# Patient Record
Sex: Female | Born: 1960 | Race: White | Hispanic: No | Marital: Married | Smoking: Current every day smoker
Health system: Southern US, Community
[De-identification: ages and names within clinical notes are randomized; demographics above are authoritative.]

## PROBLEM LIST (undated history)

## (undated) DIAGNOSIS — S069X9A Unspecified intracranial injury with loss of consciousness of unspecified duration, initial encounter: Secondary | ICD-10-CM

## (undated) DIAGNOSIS — E7800 Pure hypercholesterolemia, unspecified: Secondary | ICD-10-CM

## (undated) DIAGNOSIS — S069XAA Unspecified intracranial injury with loss of consciousness status unknown, initial encounter: Secondary | ICD-10-CM

## (undated) DIAGNOSIS — F419 Anxiety disorder, unspecified: Secondary | ICD-10-CM

## (undated) DIAGNOSIS — G43909 Migraine, unspecified, not intractable, without status migrainosus: Secondary | ICD-10-CM

## (undated) DIAGNOSIS — E079 Disorder of thyroid, unspecified: Secondary | ICD-10-CM

## (undated) HISTORY — PX: FOOT SURGERY: SHX648

## (undated) HISTORY — PX: ECTOPIC PREGNANCY SURGERY: SHX613

## (undated) HISTORY — DX: Unspecified intracranial injury with loss of consciousness status unknown, initial encounter: S06.9XAA

## (undated) HISTORY — DX: Anxiety disorder, unspecified: F41.9

## (undated) HISTORY — DX: Disorder of thyroid, unspecified: E07.9

## (undated) HISTORY — DX: Pure hypercholesterolemia, unspecified: E78.00

## (undated) HISTORY — PX: SALPINGECTOMY: SHX328

## (undated) HISTORY — DX: Unspecified intracranial injury with loss of consciousness of unspecified duration, initial encounter: S06.9X9A

## (undated) HISTORY — DX: Migraine, unspecified, not intractable, without status migrainosus: G43.909

---

## 2009-11-02 ENCOUNTER — Encounter: Admission: RE | Admit: 2009-11-02 | Discharge: 2009-11-02 | Payer: Self-pay | Admitting: Family Medicine

## 2009-11-16 ENCOUNTER — Ambulatory Visit (HOSPITAL_COMMUNITY): Admission: RE | Admit: 2009-11-16 | Discharge: 2009-11-16 | Payer: Self-pay | Admitting: General Surgery

## 2009-12-06 ENCOUNTER — Encounter: Admission: RE | Admit: 2009-12-06 | Discharge: 2009-12-06 | Payer: Self-pay | Admitting: General Surgery

## 2010-09-08 HISTORY — PX: CHOLECYSTECTOMY: SHX55

## 2010-09-29 ENCOUNTER — Encounter: Payer: Self-pay | Admitting: Family Medicine

## 2010-12-02 LAB — COMPREHENSIVE METABOLIC PANEL
Albumin: 3.5 g/dL (ref 3.5–5.2)
BUN: 13 mg/dL (ref 6–23)
Creatinine, Ser: 0.78 mg/dL (ref 0.4–1.2)
GFR calc Af Amer: >60 mL/min (ref >60)
Glucose, Bld: 95 mg/dL (ref 70–99)
Potassium: 5.4 mEq/L — ABNORMAL HIGH (ref 3.5–5.1)
Total Bilirubin: 0.3 mg/dL (ref 0.3–1.2)
Total Protein: 7 g/dL (ref 6.0–8.3)

## 2019-01-03 ENCOUNTER — Ambulatory Visit: Payer: Self-pay | Admitting: Neurology

## 2019-03-03 ENCOUNTER — Telehealth: Payer: Self-pay | Admitting: *Deleted

## 2019-03-03 NOTE — Telephone Encounter (Signed)
I called the patient and LVM on home # asking for cb to discuss appt on Monday. I called the mobile # and spoke with pt. She is comfortable coming into the office. She understands masks are required and she will bring her own. Husband will also be coming to help with the history. She confirmed both have not been exposed to anyone known to have covid19 or supspected of having it within the last 2 weeks. Also she confirmed they have not been having any fever, cough, SOB, chills, respiratory symptoms in last 2 weeks. Also not traveled out of state or country in last 2 weeks. Neither has tested positive for covid19. She verbalized appreciation for the call and will be here 15-30 minutes early and understands the check-in process.

## 2019-03-07 ENCOUNTER — Ambulatory Visit: Payer: Commercial Managed Care - PPO | Admitting: Neurology

## 2019-03-07 ENCOUNTER — Other Ambulatory Visit: Payer: Self-pay

## 2019-03-07 ENCOUNTER — Encounter: Payer: Self-pay | Admitting: Neurology

## 2019-03-07 VITALS — BP 142/84 | HR 64 | Temp 96.8°F | Ht 61.0 in | Wt 180.0 lb

## 2019-03-07 DIAGNOSIS — H9190 Unspecified hearing loss, unspecified ear: Secondary | ICD-10-CM

## 2019-03-07 DIAGNOSIS — R270 Ataxia, unspecified: Secondary | ICD-10-CM

## 2019-03-07 DIAGNOSIS — G43101 Migraine with aura, not intractable, with status migrainosus: Secondary | ICD-10-CM

## 2019-03-07 DIAGNOSIS — R2689 Other abnormalities of gait and mobility: Secondary | ICD-10-CM

## 2019-03-07 DIAGNOSIS — H532 Diplopia: Secondary | ICD-10-CM | POA: Diagnosis not present

## 2019-03-07 DIAGNOSIS — R55 Syncope and collapse: Secondary | ICD-10-CM

## 2019-03-07 DIAGNOSIS — G3281 Cerebellar ataxia in diseases classified elsewhere: Secondary | ICD-10-CM

## 2019-03-07 MED ORDER — ALPRAZOLAM 0.25 MG PO TABS: ORAL_TABLET | ORAL | 0 refills | Status: DC

## 2019-03-07 MED ORDER — ATENOLOL 25 MG PO TABS: 25.0000 mg | ORAL_TABLET | Freq: Every day | ORAL | 6 refills | Status: DC
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
## 2019-03-07 NOTE — Patient Instructions (Addendum)
Cut Atenolol in half Email me blood pressure readings over the next week - 2 weeks Then we may stop Arenolol, but may need to start a different blood pressure medication If we needed to start a different medication that controlled blood pressure and migraines: Verapamil or Amlodipine Also consider Ajovy or Emgality or Aimovig if needed for migraine   Fremanezumab injection What is this medicine? FREMANEZUMAB (fre ma NEZ ue mab) is used to prevent migraine headaches. This medicine may be used for other purposes; ask your health care provider or pharmacist if you have questions. COMMON BRAND NAME(S): AJOVY What should I tell my health care provider before I take this medicine? They need to know if you have any of these conditions:  an unusual or allergic reaction to fremanezumab, other medicines, foods, dyes, or preservatives  pregnant or trying to get pregnant  breast-feeding How should I use this medicine? This medicine is for injection under the skin. You will be taught how to prepare and give this medicine. Use exactly as directed. Take your medicine at regular intervals. Do not take your medicine more often than directed. It is important that you put your used needles and syringes in a special sharps container. Do not put them in a trash can. If you do not have a sharps container, call your pharmacist or healthcare provider to get one. Talk to your pediatrician regarding the use of this medicine in children. Special care may be needed. Overdosage: If you think you have taken too much of this medicine contact a poison control center or emergency room at once. NOTE: This medicine is only for you. Do not share this medicine with others. What if I miss a dose? If you miss a dose, take it as soon as you can. If it is almost time for your next dose, take only that dose. Do not take double or extra doses. What may interact with this medicine? Interactions are not expected. This list may not  describe all possible interactions. Give your health care provider a list of all the medicines, herbs, non-prescription drugs, or dietary supplements you use. Also tell them if you smoke, drink alcohol, or use illegal drugs. Some items may interact with your medicine. What should I watch for while using this medicine? Tell your doctor or healthcare professional if your symptoms do not start to get better or if they get worse. What side effects may I notice from receiving this medicine? Side effects that you should report to your doctor or health care professional as soon as possible:  allergic reactions like skin rash, itching or hives, swelling of the face, lips, or tongue Side effects that usually do not require medical attention (report these to your doctor or health care professional if they continue or are bothersome):  pain, redness, or irritation at site where injected This list may not describe all possible side effects. Call your doctor for medical advice about side effects. You may report side effects to FDA at 1-800-FDA-1088. Where should I keep my medicine? Keep out of the reach of children. You will be instructed on how to store this medicine. Throw away any unused medicine after the expiration date on the label. NOTE: This sheet is a summary. It may not cover all possible information. If you have questions about this medicine, talk to your doctor, pharmacist, or health care provider.  2020 Elsevier/Gold Standard (2017-05-25 17:22:56)  Verapamil tablets What is this medicine? VERAPAMIL (ver AP a mil) is a calcium-channel blocker.  It affects the amount of calcium found in your heart and muscle cells. This relaxes your blood vessels, which can reduce the amount of work the heart has to do. This medicine is used to treat chest pain caused by angina, high blood pressure, and controls heart rate in certain conditions. This medicine may be used for other purposes; ask your health care  provider or pharmacist if you have questions. COMMON BRAND NAME(S): Calan What should I tell my health care provider before I take this medicine? They need to know if you have any of these conditions:  heart or blood vessel disease  heart rhythm disturbances such as sick sinus syndrome, ventricular arrhythmias, Wolff-Parkinson-White syndrome, or Lown-Ganong-Levine syndrome  liver or kidney disease  low blood pressure  an unusual or allergic reaction to verapamil, other medicines, foods, dyes, or preservatives  pregnant or trying to get pregnant  breast-feeding How should I use this medicine? Take this medicine by mouth with a glass of water. Follow the directions on the prescription label. This medicine can be taken with or without food. Take your doses at regular intervals. Do not take your medicine more often than directed. Talk to your pediatrician regarding the use of this medicine in children. Special care may be needed. Overdosage: If you think you have taken too much of this medicine contact a poison control center or emergency room at once. NOTE: This medicine is only for you. Do not share this medicine with others. What if I miss a dose? If you miss a dose, take it as soon as you can. If it is almost time for your next dose, take only that dose. Do not take double or extra doses. What may interact with this medicine? Do not take this medicine with any of the following:  cisapride  disopyramide  dofetilide  grapefruit juice  hawthorn  pimozide  red yeast rice This medicine may also interact with the following medications:  barbiturates such as phenobarbital  cimetidine  cyclosporine  lithium  local anesthetics or general anesthetics  medicines for heart rhythm problems like amiodarone, digoxin, flecainide, procainamide, quinidine  medicines for high blood pressure or heart problems  medicines for seizures like carbamazepine and phenytoin  rifampin,  rifabutin or rifapentine  theophylline or aminophylline This list may not describe all possible interactions. Give your health care provider a list of all the medicines, herbs, non-prescription drugs, or dietary supplements you use. Also tell them if you smoke, drink alcohol, or use illegal drugs. Some items may interact with your medicine. What should I watch for while using this medicine? Check your blood pressure and pulse rate regularly. Ask your doctor or health care professional what your blood pressure and pulse rate should be and when you should contact him or her. Do not suddenly stop taking this medicine. Ask your doctor or health care professional how to gradually reduce the dose. You may get drowsy or dizzy. Do not drive, use machinery, or do anything that needs mental alertness until you know how this medicine affects you. Do not stand or sit up quickly, especially if you are an older patient. This reduces the risk of dizzy or fainting spells. Alcohol may interfere with the effect of this medicine. Avoid alcoholic drinks. What side effects may I notice from receiving this medicine? Side effects that you should report to your doctor or health care professional as soon as possible:  difficulty breathing  dizziness or light headedness  fainting  fast heartbeat, palpitations, irregular heartbeat,  or chest pain  skin rash  slow heartbeat  swelling of the legs or ankles Side effects that usually do not require medical attention (report to your doctor or health care professional if they continue or are bothersome):  constipation  facial flushing  headache  nausea, vomiting  sexual dysfunction  weakness or tiredness This list may not describe all possible side effects. Call your doctor for medical advice about side effects. You may report side effects to FDA at 1-800-FDA-1088. Where should I keep my medicine? Keep out of the reach of children. Store at room temperature  between 15 and 25 degrees C (59 and 77 degrees F). Protect from light. Keep container tightly closed. Throw away any unused medicine after the expiration date. NOTE: This sheet is a summary. It may not cover all possible information. If you have questions about this medicine, talk to your doctor, pharmacist, or health care provider.  2020 Elsevier/Gold Standard (2008-05-22 17:23:53)   Amlodipine tablets What is this medicine? AMLODIPINE (am LOE di peen) is a calcium-channel blocker. It affects the amount of calcium found in your heart and muscle cells. This relaxes your blood vessels, which can reduce the amount of work the heart has to do. This medicine is used to lower high blood pressure. It is also used to prevent chest pain. This medicine may be used for other purposes; ask your health care provider or pharmacist if you have questions. COMMON BRAND NAME(S): Norvasc What should I tell my health care provider before I take this medicine? They need to know if you have any of these conditions:  heart disease  liver disease  an unusual or allergic reaction to amlodipine, other medicines, foods, dyes, or preservatives  pregnant or trying to get pregnant  breast-feeding How should I use this medicine? Take this medicine by mouth with a glass of water. Follow the directions on the prescription label. You can take it with or without food. If it upsets your stomach, take it with food. Take your medicine at regular intervals. Do not take it more often than directed. Do not stop taking except on your doctor's advice. Talk to your pediatrician regarding the use of this medicine in children. While this drug may be prescribed for children as young as 6 years for selected conditions, precautions do apply. Patients over 59 years of age may have a stronger reaction and need a smaller dose. Overdosage: If you think you have taken too much of this medicine contact a poison control center or emergency  room at once. NOTE: This medicine is only for you. Do not share this medicine with others. What if I miss a dose? If you miss a dose, take it as soon as you can. If it is almost time for your next dose, take only that dose. Do not take double or extra doses. What may interact with this medicine? Do not take this medicine with any of the following medications:  tranylcypromine This medicine may also interact with the following medications:  clarithromycin  cyclosporine  diltiazem  itraconazole  simvastatin  tacrolimus This list may not describe all possible interactions. Give your health care provider a list of all the medicines, herbs, non-prescription drugs, or dietary supplements you use. Also tell them if you smoke, drink alcohol, or use illegal drugs. Some items may interact with your medicine. What should I watch for while using this medicine? Visit your healthcare professional for regular checks on your progress. Check your blood pressure as directed. Ask  your healthcare professional what your blood pressure should be and when you should contact him or her. Do not treat yourself for coughs, colds, or pain while you are using this medicine without asking your healthcare professional for advice. Some medicines may increase your blood pressure. You may get dizzy. Do not drive, use machinery, or do anything that needs mental alertness until you know how this medicine affects you. Do not stand or sit up quickly, especially if you are an older patient. This reduces the risk of dizzy or fainting spells. Avoid alcoholic drinks; they can make you dizzier. What side effects may I notice from receiving this medicine? Side effects that you should report to your doctor or health care professional as soon as possible:  allergic reactions like skin rash, itching or hives; swelling of the face, lips, or tongue  fast, irregular heartbeat  signs and symptoms of low blood pressure like dizziness;  feeling faint or lightheaded, falls; unusually weak or tired  swelling of ankles, feet, hands Side effects that usually do not require medical attention (report these to your doctor or health care professional if they continue or are bothersome):  dry mouth  facial flushing  headache  stomach pain  tiredness This list may not describe all possible side effects. Call your doctor for medical advice about side effects. You may report side effects to FDA at 1-800-FDA-1088. Where should I keep my medicine? Keep out of the reach of children. Store at room temperature between 59 and 86 degrees F (15 and 30 degrees C). Throw away any unused medicine after the expiration date. NOTE: This sheet is a summary. It may not cover all possible information. If you have questions about this medicine, talk to your doctor, pharmacist, or health care provider.  2020 Elsevier/Gold Standard (2018-03-19 15:07:10)

## 2019-03-08 ENCOUNTER — Other Ambulatory Visit: Payer: Self-pay | Admitting: Neurology

## 2019-03-08 ENCOUNTER — Telehealth: Payer: Self-pay | Admitting: *Deleted

## 2019-03-08 DIAGNOSIS — D72829 Elevated white blood cell count, unspecified: Secondary | ICD-10-CM

## 2019-03-08 LAB — CBC
Hematocrit: 42.4 % (ref 34.0–46.6)
Hemoglobin: 14.3 g/dL (ref 11.1–15.9)
MCH: 30.2 pg (ref 26.6–33.0)
MCHC: 33.7 g/dL (ref 31.5–35.7)
MCV: 90 fL (ref 79–97)
Platelets: 417 10*3/uL (ref 150–450)
RBC: 4.74 x10E6/uL (ref 3.77–5.28)
RDW: 12.8 % (ref 11.7–15.4)
WBC: 13.4 10*3/uL — ABNORMAL HIGH (ref 3.4–10.8)

## 2019-03-08 LAB — BASIC METABOLIC PANEL
BUN/Creatinine Ratio: 35 — ABNORMAL HIGH (ref 9–23)
BUN: 30 mg/dL — ABNORMAL HIGH (ref 6–24)
CO2: 22 mmol/L (ref 20–29)
Calcium: 9.3 mg/dL (ref 8.7–10.2)
Chloride: 107 mmol/L — ABNORMAL HIGH (ref 96–106)
Creatinine, Ser: 0.85 mg/dL (ref 0.57–1.00)
GFR calc Af Amer: 87 mL/min/{1.73_m2} (ref >59)
GFR calc non Af Amer: 75 mL/min/{1.73_m2} (ref >59)
Glucose: 90 mg/dL (ref 65–99)
Potassium: 4.7 mmol/L (ref 3.5–5.2)
Sodium: 142 mmol/L (ref 134–144)

## 2019-03-08 NOTE — Telephone Encounter (Signed)
Spoke with pt. She understands the lab results. She will come tomorrow at 10:30 for lab redraw. She also stated she has been hydrating, said things have been different as far as her fluid retention since she started "this pill". She will bring her own mask.

## 2019-03-08 NOTE — Telephone Encounter (Signed)
-----   Message from Melvenia Beam, MD sent at 03/08/2019 10:00 AM EDT ----- Her white blood cells are elevated and she look dehydrated. Can she come back and repeat the CBC to make sure there were no errors?

## 2019-03-09 ENCOUNTER — Other Ambulatory Visit: Payer: Self-pay

## 2019-03-09 ENCOUNTER — Other Ambulatory Visit (INDEPENDENT_AMBULATORY_CARE_PROVIDER_SITE_OTHER): Payer: Self-pay

## 2019-03-09 ENCOUNTER — Telehealth: Payer: Self-pay | Admitting: Neurology

## 2019-03-09 DIAGNOSIS — Z0289 Encounter for other administrative examinations: Secondary | ICD-10-CM

## 2019-03-09 DIAGNOSIS — D72829 Elevated white blood cell count, unspecified: Secondary | ICD-10-CM

## 2019-03-09 NOTE — Telephone Encounter (Signed)
Pt returning call please call back °

## 2019-03-09 NOTE — Telephone Encounter (Signed)
I spoke to the patient she is scheduled for 03/16/19 at [facility].  °

## 2019-03-09 NOTE — Telephone Encounter (Signed)
LVM for pt to call back about scheduling mri  [facility] auth: [id number] (exp. 03/09/19 to 04/09/19)

## 2019-03-10 LAB — CBC WITH DIFFERENTIAL/PLATELET
Basophils Absolute: 0.1 10*3/uL (ref 0.0–0.2)
Basos: 1 %
EOS (ABSOLUTE): 0.4 10*3/uL (ref 0.0–0.4)
Eos: 4 %
Hematocrit: 41.6 % (ref 34.0–46.6)
Hemoglobin: 13.9 g/dL (ref 11.1–15.9)
Immature Grans (Abs): 0 10*3/uL (ref 0.0–0.1)
Immature Granulocytes: 0 %
Lymphocytes Absolute: 3.8 10*3/uL — ABNORMAL HIGH (ref 0.7–3.1)
Lymphs: 34 %
MCH: 29.8 pg (ref 26.6–33.0)
MCHC: 33.4 g/dL (ref 31.5–35.7)
MCV: 89 fL (ref 79–97)
Monocytes Absolute: 0.9 10*3/uL (ref 0.1–0.9)
Monocytes: 8 %
Neutrophils Absolute: 6 10*3/uL (ref 1.4–7.0)
Neutrophils: 53 %
Platelets: 392 10*3/uL (ref 150–450)
RBC: 4.66 x10E6/uL (ref 3.77–5.28)
RDW: 12.6 % (ref 11.7–15.4)
WBC: 11.2 10*3/uL — ABNORMAL HIGH (ref 3.4–10.8)

## 2019-03-16 ENCOUNTER — Other Ambulatory Visit: Payer: Self-pay

## 2019-03-16 ENCOUNTER — Ambulatory Visit: Payer: Commercial Managed Care - PPO

## 2019-03-16 DIAGNOSIS — H9190 Unspecified hearing loss, unspecified ear: Secondary | ICD-10-CM

## 2019-03-16 DIAGNOSIS — R270 Ataxia, unspecified: Secondary | ICD-10-CM

## 2019-03-16 DIAGNOSIS — R2689 Other abnormalities of gait and mobility: Secondary | ICD-10-CM

## 2019-03-16 DIAGNOSIS — H532 Diplopia: Secondary | ICD-10-CM | POA: Diagnosis not present

## 2019-03-16 DIAGNOSIS — R55 Syncope and collapse: Secondary | ICD-10-CM

## 2019-03-16 MED ORDER — GADOBENATE DIMEGLUMINE 529 MG/ML IV SOLN
17.0000 mL | Freq: Once | INTRAVENOUS | Status: AC | PRN
  Administered 2019-03-16: 17 mL via INTRAVENOUS

## 2019-05-25 ENCOUNTER — Other Ambulatory Visit: Payer: Self-pay | Admitting: Neurology

## 2019-06-13 ENCOUNTER — Ambulatory Visit: Payer: Commercial Managed Care - PPO | Admitting: Neurology

## 2019-07-13 ENCOUNTER — Ambulatory Visit: Payer: Commercial Managed Care - PPO | Admitting: Neurology

## 2019-08-14 ENCOUNTER — Other Ambulatory Visit: Payer: Self-pay | Admitting: Neurology

## 2019-09-07 ENCOUNTER — Other Ambulatory Visit: Payer: Self-pay | Admitting: Neurology

## 2019-09-12 ENCOUNTER — Ambulatory Visit: Payer: Commercial Managed Care - PPO | Admitting: Neurology

## 2019-09-12 ENCOUNTER — Encounter: Payer: Self-pay | Admitting: Neurology

## 2019-09-12 ENCOUNTER — Other Ambulatory Visit: Payer: Self-pay

## 2019-09-12 VITALS — BP 137/86 | HR 60 | Temp 98.0°F | Ht 60.0 in | Wt 173.0 lb

## 2019-09-12 DIAGNOSIS — H8190 Unspecified disorder of vestibular function, unspecified ear: Secondary | ICD-10-CM

## 2019-09-12 MED ORDER — ONDANSETRON 4 MG PO TBDP: 4.0000 mg | ORAL_TABLET | Freq: Three times a day (TID) | ORAL | 6 refills | Status: AC | PRN
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
## 2019-10-03 ENCOUNTER — Other Ambulatory Visit: Payer: Self-pay | Admitting: Neurology

## 2019-10-30 ENCOUNTER — Other Ambulatory Visit: Payer: Self-pay | Admitting: Neurology

## 2019-11-03 ENCOUNTER — Other Ambulatory Visit: Payer: Self-pay | Admitting: Neurology

## 2019-11-25 ENCOUNTER — Other Ambulatory Visit: Payer: Self-pay | Admitting: Neurology

## 2021-02-26 ENCOUNTER — Other Ambulatory Visit: Payer: Self-pay | Admitting: Physician Assistant

## 2021-02-26 DIAGNOSIS — Z122 Encounter for screening for malignant neoplasm of respiratory organs: Secondary | ICD-10-CM

## 2021-02-26 DIAGNOSIS — Z1231 Encounter for screening mammogram for malignant neoplasm of breast: Secondary | ICD-10-CM

## 2021-02-26 DIAGNOSIS — Z720 Tobacco use: Secondary | ICD-10-CM

## 2021-03-14 ENCOUNTER — Other Ambulatory Visit: Payer: Self-pay

## 2021-03-14 ENCOUNTER — Ambulatory Visit
Admission: RE | Admit: 2021-03-14 | Discharge: 2021-03-14 | Disposition: A | Discharge status: Home / Self Care | Payer: Commercial Managed Care - PPO | Source: Ambulatory Visit | Attending: Physician Assistant | Admitting: Physician Assistant

## 2021-03-14 DIAGNOSIS — Z1231 Encounter for screening mammogram for malignant neoplasm of breast: Secondary | ICD-10-CM

## 2021-03-18 ENCOUNTER — Ambulatory Visit: Payer: Self-pay

## 2022-01-22 IMAGING — MG MM DIGITAL SCREENING BILAT W/ TOMO AND CAD
6 of 10 series · 6 of 30 positions shown · non-contrast
Comparison: Previous exam(s).

CLINICAL DATA: Screening.

EXAM:
DIGITAL SCREENING BILATERAL MAMMOGRAM WITH TOMOSYNTHESIS AND CAD
TECHNIQUE: Bilateral screening digital craniocaudal and mediolateral oblique
mammograms were obtained. Bilateral screening digital breast
tomosynthesis was performed. The images were evaluated with
computer-aided detection.

[R CC synth-2D]
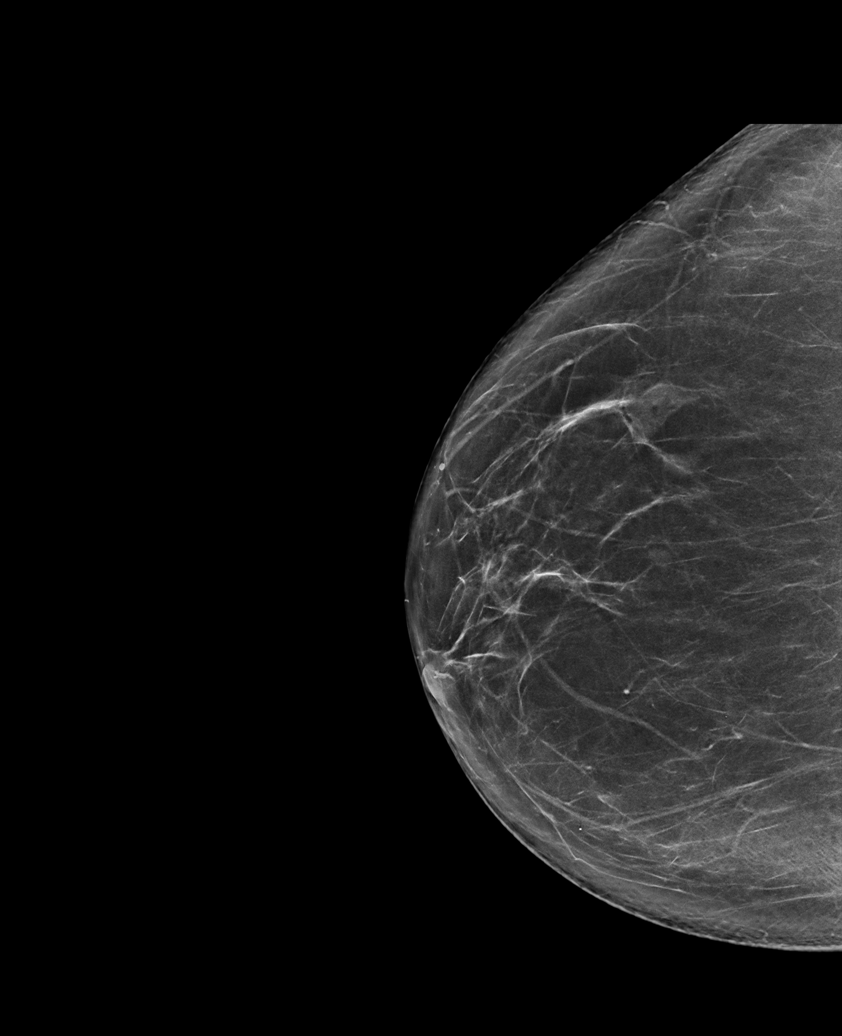

[L MLO synth-2D (1 of 2)]
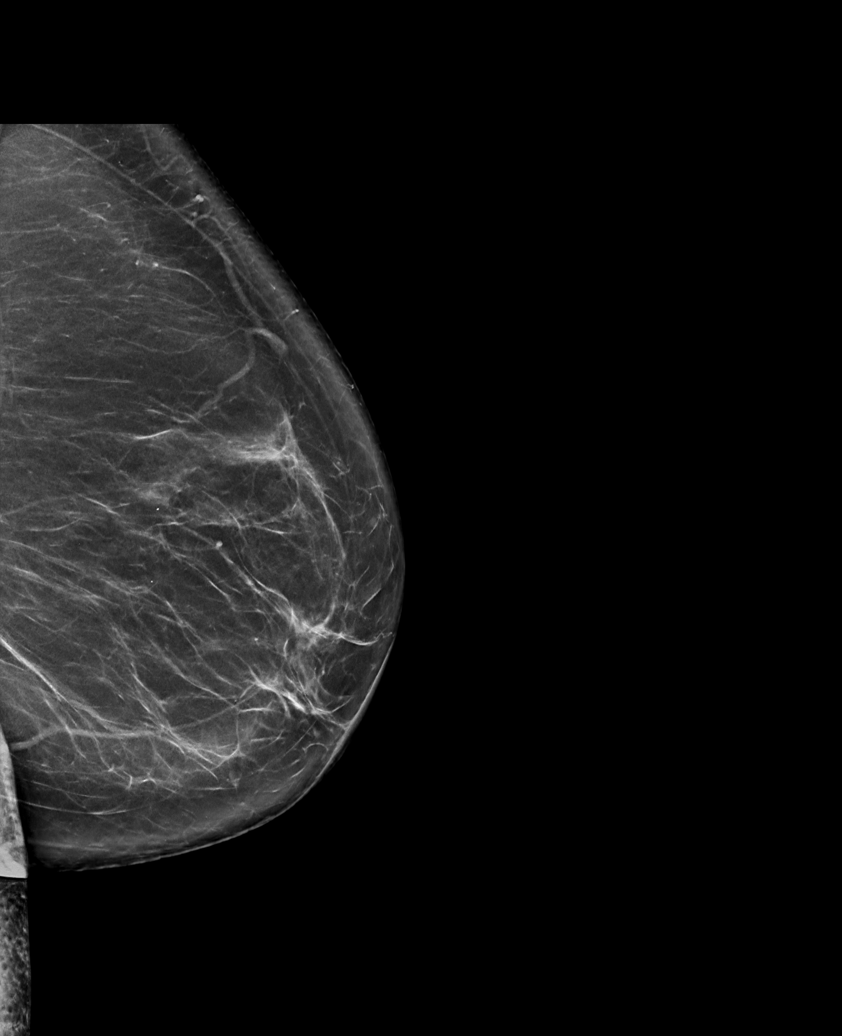

[L MLO synth-2D (2 of 2)]
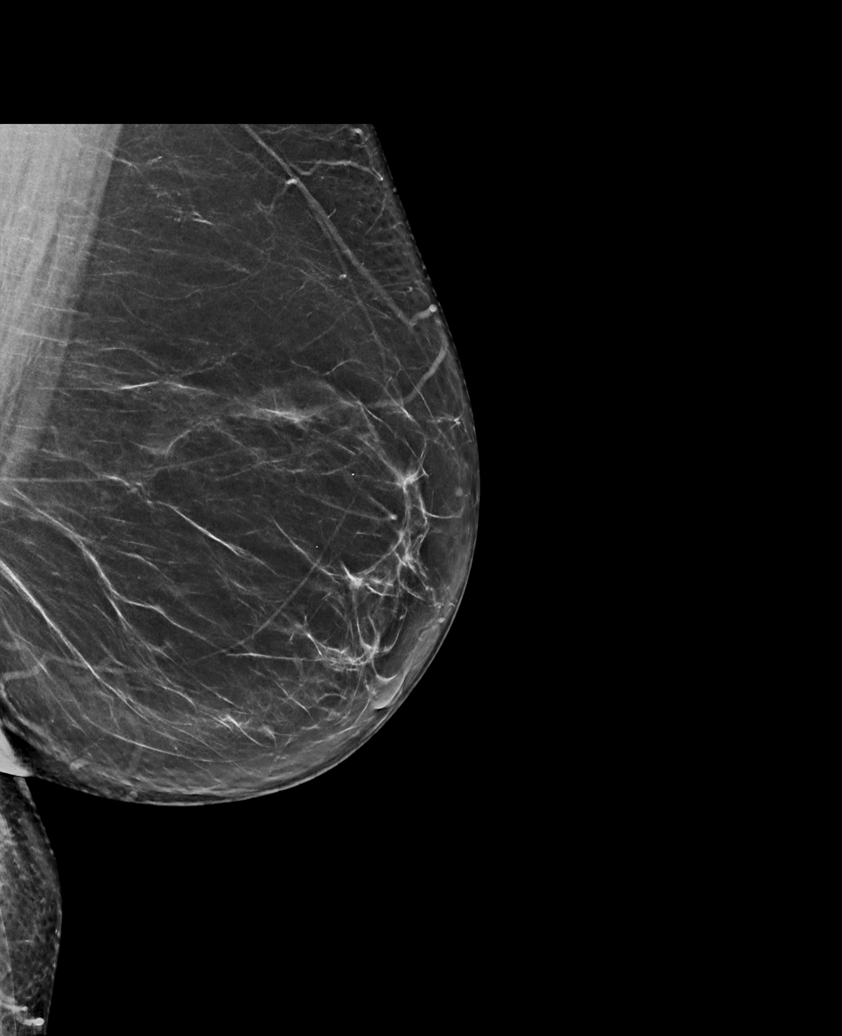

[L CC synth-2D]
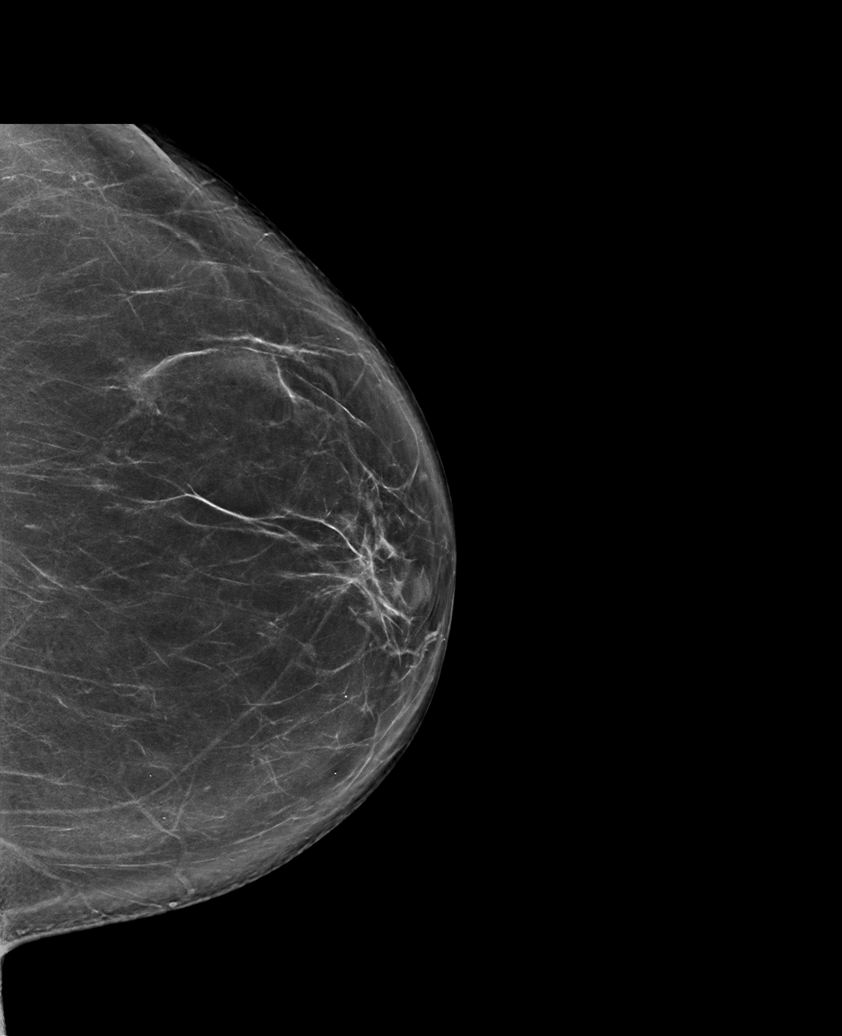

[R MLO synth-2D]
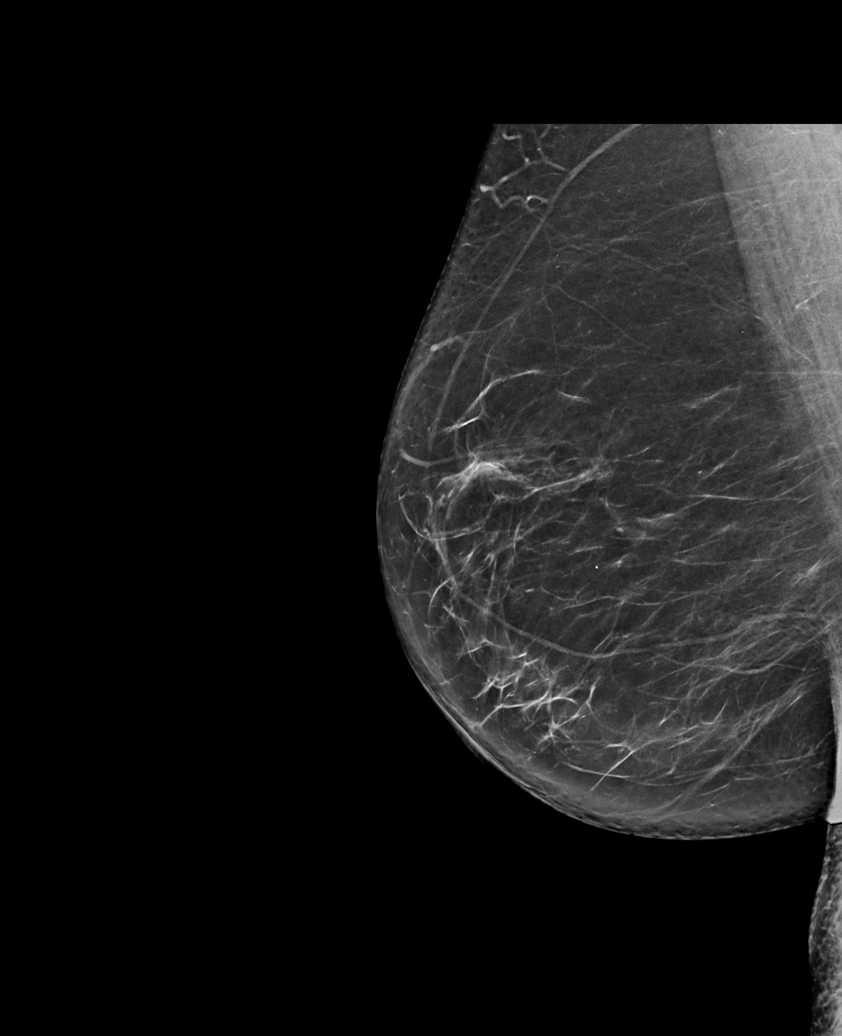

[R MLO tomo · tomo slice 43/84.0]
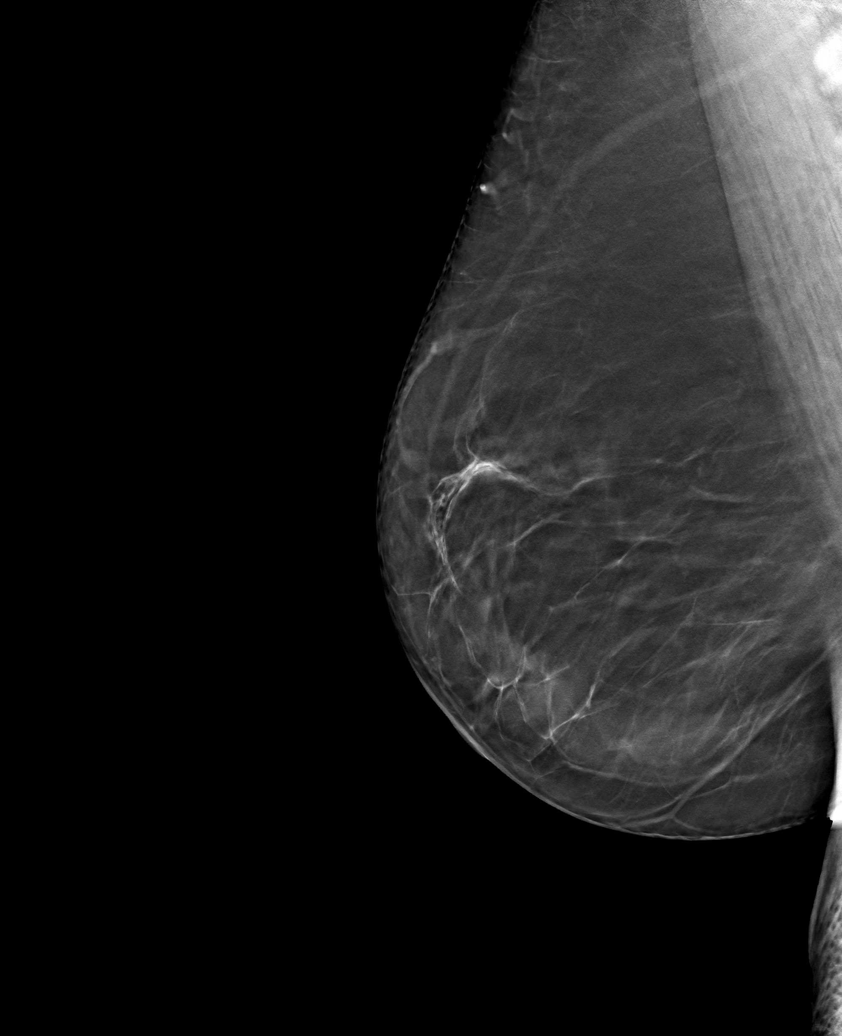

[6 of 30 positions shown; findings below may reference images not displayed]

ACR Breast Density Category b: There are scattered areas of
fibroglandular density.
FINDINGS: There are no findings suspicious for malignancy.
IMPRESSION: No mammographic evidence of malignancy. A result letter of this
screening mammogram will be mailed directly to the patient.

RECOMMENDATION:
Screening mammogram in one year. (Code:51-O-LD2)

BI-RADS CATEGORY  1: Negative.

## 2022-05-30 ENCOUNTER — Other Ambulatory Visit: Payer: Self-pay | Admitting: Physician Assistant

## 2022-05-30 DIAGNOSIS — F17200 Nicotine dependence, unspecified, uncomplicated: Secondary | ICD-10-CM

## 2022-06-02 ENCOUNTER — Other Ambulatory Visit: Payer: Self-pay | Admitting: Physician Assistant

## 2022-06-02 DIAGNOSIS — Z1231 Encounter for screening mammogram for malignant neoplasm of breast: Secondary | ICD-10-CM

## 2022-06-30 ENCOUNTER — Ambulatory Visit: Payer: Commercial Managed Care - PPO

## 2022-08-08 ENCOUNTER — Ambulatory Visit
Admission: RE | Admit: 2022-08-08 | Discharge: 2022-08-08 | Disposition: A | Discharge status: Home / Self Care | Payer: Commercial Managed Care - PPO | Source: Ambulatory Visit | Attending: Physician Assistant | Admitting: Physician Assistant

## 2022-08-08 DIAGNOSIS — Z1231 Encounter for screening mammogram for malignant neoplasm of breast: Secondary | ICD-10-CM
